# Patient Record
Sex: Male | Born: 1990 | Race: White | Hispanic: No | Marital: Single | Smoking: Never smoker
Health system: Southern US, Community
[De-identification: ages and names within clinical notes are randomized; demographics above are authoritative.]

## PROBLEM LIST (undated history)

## (undated) HISTORY — PX: HERNIA REPAIR: SHX51

---

## 2018-08-31 ENCOUNTER — Other Ambulatory Visit: Payer: Self-pay

## 2018-08-31 ENCOUNTER — Emergency Department
Admission: EM | Admit: 2018-08-31 | Discharge: 2018-08-31 | Disposition: A | Discharge status: Home / Self Care | Payer: Self-pay | Attending: Student in an Organized Health Care Education/Training Program | Admitting: Student in an Organized Health Care Education/Training Program

## 2018-08-31 ENCOUNTER — Emergency Department: Payer: Self-pay

## 2018-08-31 DIAGNOSIS — F410 Panic disorder [episodic paroxysmal anxiety] without agoraphobia: Secondary | ICD-10-CM | POA: Insufficient documentation

## 2018-08-31 DIAGNOSIS — R079 Chest pain, unspecified: Secondary | ICD-10-CM | POA: Insufficient documentation

## 2018-08-31 LAB — CBC
HCT: 49 % (ref 39.0–52.0)
Hemoglobin: 17.3 g/dL — ABNORMAL HIGH (ref 13.0–17.0)
MCH: 29.8 pg (ref 26.0–34.0)
MCHC: 35.3 g/dL (ref 30.0–36.0)
MCV: 84.5 fL (ref 80.0–100.0)
NRBC: 0 % (ref 0.0–0.2)
Platelets: 304 10*3/uL (ref 150–400)
RBC: 5.8 MIL/uL (ref 4.22–5.81)
RDW: 11.8 % (ref 11.5–15.5)
WBC: 8.8 10*3/uL (ref 4.0–10.5)

## 2018-08-31 LAB — BASIC METABOLIC PANEL
Anion gap: 10 (ref 5–15)
BUN: 7 mg/dL (ref 6–20)
CO2: 27 mmol/L (ref 22–32)
Calcium: 9.3 mg/dL (ref 8.9–10.3)
Chloride: 100 mmol/L (ref 98–111)
Creatinine, Ser: 0.85 mg/dL (ref 0.61–1.24)
GFR calc non Af Amer: >60 mL/min (ref >60)
Glucose, Bld: 104 mg/dL — ABNORMAL HIGH (ref 70–99)
Potassium: 3.3 mmol/L — ABNORMAL LOW (ref 3.5–5.1)
Sodium: 137 mmol/L (ref 135–145)

## 2018-08-31 LAB — TROPONIN I: Troponin I: <0.03 ng/mL (ref <0.03)

## 2018-08-31 MED ORDER — LORAZEPAM 1 MG PO TABS: 1.0000 mg | ORAL_TABLET | Freq: Three times a day (TID) | ORAL | 0 refills | Status: AC | PRN

## 2018-08-31 MED ORDER — LORAZEPAM 1 MG PO TABS
1.0000 mg | ORAL_TABLET | Freq: Once | ORAL | Status: AC
  Administered 2018-08-31: 1 mg via ORAL
  Filled 2018-08-31: qty 1

## 2018-08-31 NOTE — ED Notes (Addendum)
Pt reports mid chest discomfort that has been intermittent, pt states the pain is non-radiating and denies any shortness of breath. Pt states he thinks he was having a panic attack when the discomfort started. Pt took dose of metoprolol succinate 25mg  around 1400, pt states he used it to help with anxiety This medication is not currently prescribed to him.

## 2018-08-31 NOTE — ED Triage Notes (Signed)
Pt to ER via POV c/o burning like chest pain that started approx 45 minutes ago associated with SOB. Pt alert and oriented X4, active, cooperative, pt in NAD. RR even and unlabored, color WNL.

## 2018-08-31 NOTE — ED Provider Notes (Signed)
[facility] Emergency Department Provider Note    First MD Initiated Contact with Patient 08/31/18 1650     (approximate)  I have reviewed the triage vital signs and the nursing notes.   HISTORY  Chief Complaint Chest Pain    HPI Valentino SaxonKirk Vossler is a 27 y.o. male the ER for evaluation of chest discomfort with palpitations that started earlier today around 2:00.  Patient states he does have a history of anxiety.  States he took a dose of metoprolol to help with this but is not actually prescribed to him.  Denies any chest pain or pressure right now.  No pleuritic pain.  No recent fevers or shortness of breath.  States he still feels very anxious and like he is having a panic attack.  Denies any SI or HI.    History reviewed. No pertinent past medical history. No family history on file. History reviewed. No pertinent surgical history. There are no active problems to display for this patient.     Prior to Admission medications   Medication Sig Start Date End Date Taking? Authorizing Provider  LORazepam (ATIVAN) 1 MG tablet Take 1 tablet (1 mg total) by mouth every 8 (eight) hours as needed for anxiety. 08/31/18 08/31/19  Willy Eddyobinson, Aya Geisel, MD    Allergies Patient has no known allergies.    Social History Social History   Tobacco Use  . Smoking status: Never Smoker  Substance Use Topics  . Alcohol use: Not Currently  . Drug use: Not on file    Review of Systems Patient denies headaches, rhinorrhea, blurry vision, numbness, shortness of breath, chest pain, edema, cough, abdominal pain, nausea, vomiting, diarrhea, dysuria, fevers, rashes or hallucinations unless otherwise stated above in HPI. ____________________________________________   PHYSICAL EXAM:  VITAL SIGNS: Vitals:   08/31/18 1543 08/31/18 1758  BP: (!) 138/99 (!) 136/100  Pulse: (!) 108 95  Resp: 18 18  Temp: 98.4 F (36.9 C)   SpO2: 100% 99%    Constitutional: Alert and  oriented.  Eyes: Conjunctivae are normal.  Head: Atraumatic. Nose: No congestion/rhinnorhea. Mouth/Throat: Mucous membranes are moist.   Neck: No stridor. Painless ROM.  Cardiovascular: Normal rate, regular rhythm. Grossly normal heart sounds.  Good peripheral circulation. Respiratory: Normal respiratory effort.  No retractions. Lungs CTAB. Gastrointestinal: Soft and nontender. No distention. No abdominal bruits. No CVA tenderness. Genitourinary:  Musculoskeletal: No lower extremity tenderness nor edema.  No joint effusions. Neurologic:  Normal speech and language. No gross focal neurologic deficits are appreciated. No facial droop Skin:  Skin is warm, dry and intact. No rash noted. Psychiatric: Mood and affect are anxious. Speech and behavior are normal.  ____________________________________________   LABS (all labs ordered are listed, but only abnormal results are displayed)  Results for orders placed or performed during the hospital encounter of 08/31/18 (from the past 24 hour(s))  Basic metabolic panel     Status: Abnormal   Collection Time: 08/31/18  3:45 PM  Result Value Ref Range   Sodium 137 135 - 145 mmol/L   Potassium 3.3 (L) 3.5 - 5.1 mmol/L   Chloride 100 98 - 111 mmol/L   CO2 27 22 - 32 mmol/L   Glucose, Bld 104 (H) 70 - 99 mg/dL   BUN 7 6 - 20 mg/dL   Creatinine, Ser 1.610.85 0.61 - 1.24 mg/dL   Calcium 9.3 8.9 - 09.610.3 mg/dL   GFR calc non Af Amer >60 >60 mL/min   GFR calc Af Amer >60 >60  mL/min   Anion gap 10 5 - 15  CBC     Status: Abnormal   Collection Time: 08/31/18  3:45 PM  Result Value Ref Range   WBC 8.8 4.0 - 10.5 K/uL   RBC 5.80 4.22 - 5.81 MIL/uL   Hemoglobin 17.3 (H) 13.0 - 17.0 g/dL   HCT 44.0 34.7 - 42.5 %   MCV 84.5 80.0 - 100.0 fL   MCH 29.8 26.0 - 34.0 pg   MCHC 35.3 30.0 - 36.0 g/dL   RDW 95.6 38.7 - 56.4 %   Platelets 304 150 - 400 K/uL   nRBC 0.0 0.0 - 0.2 %  Troponin I - ONCE - STAT     Status: None   Collection Time: 08/31/18  3:45 PM    Result Value Ref Range   Troponin I <0.03 <0.03 ng/mL   ____________________________________________  EKG My review and personal interpretation at Time: 15:40   Indication: chest pain  Rate: 98  Rhythm: sinus Axis: normal Other: no brugada wpw or prolonged qt ____________________________________________  RADIOLOGY  I personally reviewed all radiographic images ordered to evaluate for the above acute complaints and reviewed radiology reports and findings.  These findings were personally discussed with the patient.  Please see medical record for radiology report.  ____________________________________________   PROCEDURES  Procedure(s) performed:  Procedures    Critical Care performed: no ____________________________________________   INITIAL IMPRESSION / ASSESSMENT AND PLAN / ED COURSE  Pertinent labs & imaging results that were available during my care of the patient were reviewed by me and considered in my medical decision making (see chart for details).   DDX: ACS, pericarditis, esophagitis, boerhaaves, pe, dissection, pna, bronchitis, costochondritis    Kawhi Diebold is a 27 y.o. who presents to the ED with was as described above.  EKG shows no evidence of acute ischemia.  Does not seem clinically consistent with dissection or PE.  No signs of infectious process.  Most likely panic attack.  Possible dysrhythmia but no evidence of WPW Brugada or prolonged QT.  Patient with improved symptoms after receiving Ativan.  Do believe he stable and appropriate for referral to outpatient follow-up.      As part of my medical decision making, I reviewed the following data within the electronic MEDICAL RECORD NUMBER Nursing notes reviewed and incorporated, Labs reviewed, notes from prior ED visits .  ____________________________________________   FINAL CLINICAL IMPRESSION(S) / ED DIAGNOSES  Final diagnoses:  Chest pain, unspecified type  Panic attack      NEW MEDICATIONS  STARTED DURING THIS VISIT:  New Prescriptions   LORAZEPAM (ATIVAN) 1 MG TABLET    Take 1 tablet (1 mg total) by mouth every 8 (eight) hours as needed for anxiety.     Note:  This document was prepared using Dragon voice recognition software and may include unintentional dictation errors.    Willy Eddy, MD 08/31/18 Mikle Bosworth

## 2018-09-22 ENCOUNTER — Other Ambulatory Visit: Payer: Self-pay

## 2018-09-22 ENCOUNTER — Emergency Department
Admission: EM | Admit: 2018-09-22 | Discharge: 2018-09-22 | Disposition: A | Discharge status: Home / Self Care | Payer: Self-pay | Attending: Emergency Medicine | Admitting: Emergency Medicine

## 2018-09-22 ENCOUNTER — Encounter: Payer: Self-pay | Admitting: Emergency Medicine

## 2018-09-22 DIAGNOSIS — R030 Elevated blood-pressure reading, without diagnosis of hypertension: Secondary | ICD-10-CM | POA: Insufficient documentation

## 2018-09-22 DIAGNOSIS — R000 Tachycardia, unspecified: Secondary | ICD-10-CM | POA: Insufficient documentation

## 2018-09-22 DIAGNOSIS — F419 Anxiety disorder, unspecified: Secondary | ICD-10-CM | POA: Insufficient documentation

## 2018-09-22 DIAGNOSIS — F17228 Nicotine dependence, chewing tobacco, with other nicotine-induced disorders: Secondary | ICD-10-CM | POA: Insufficient documentation

## 2018-09-22 LAB — COMPREHENSIVE METABOLIC PANEL
ALT: 43 U/L (ref 0–44)
AST: 51 U/L — AB (ref 15–41)
Albumin: 5.1 g/dL — ABNORMAL HIGH (ref 3.5–5.0)
Alkaline Phosphatase: 64 U/L (ref 38–126)
Anion gap: 11 (ref 5–15)
BUN: 7 mg/dL (ref 6–20)
CO2: 28 mmol/L (ref 22–32)
Calcium: 9.4 mg/dL (ref 8.9–10.3)
Chloride: 97 mmol/L — ABNORMAL LOW (ref 98–111)
Creatinine, Ser: 0.78 mg/dL (ref 0.61–1.24)
GFR calc Af Amer: >60 mL/min (ref >60)
GFR calc non Af Amer: >60 mL/min (ref >60)
GLUCOSE: 87 mg/dL (ref 70–99)
Potassium: 4.4 mmol/L (ref 3.5–5.1)
Sodium: 136 mmol/L (ref 135–145)
Total Bilirubin: 1 mg/dL (ref 0.3–1.2)
Total Protein: 8.2 g/dL — ABNORMAL HIGH (ref 6.5–8.1)

## 2018-09-22 LAB — URINE DRUG SCREEN, QUALITATIVE (ARMC ONLY)
Amphetamines, Ur Screen: NOT DETECTED
Barbiturates, Ur Screen: NOT DETECTED
Benzodiazepine, Ur Scrn: NOT DETECTED
Cannabinoid 50 Ng, Ur ~~LOC~~: NOT DETECTED
Cocaine Metabolite,Ur ~~LOC~~: NOT DETECTED
MDMA (Ecstasy)Ur Screen: NOT DETECTED
Methadone Scn, Ur: NOT DETECTED
Opiate, Ur Screen: NOT DETECTED
PHENCYCLIDINE (PCP) UR S: NOT DETECTED
Tricyclic, Ur Screen: NOT DETECTED

## 2018-09-22 LAB — CBC
HEMATOCRIT: 51.8 % (ref 39.0–52.0)
Hemoglobin: 18 g/dL — ABNORMAL HIGH (ref 13.0–17.0)
MCH: 29.7 pg (ref 26.0–34.0)
MCHC: 34.7 g/dL (ref 30.0–36.0)
MCV: 85.5 fL (ref 80.0–100.0)
Platelets: 290 10*3/uL (ref 150–400)
RBC: 6.06 MIL/uL — ABNORMAL HIGH (ref 4.22–5.81)
RDW: 11.8 % (ref 11.5–15.5)
WBC: 6.8 10*3/uL (ref 4.0–10.5)
nRBC: 0 % (ref 0.0–0.2)

## 2018-09-22 LAB — ETHANOL: Alcohol, Ethyl (B): <10 mg/dL (ref <10)

## 2018-09-22 LAB — TSH: TSH: 2.17 u[IU]/mL (ref 0.350–4.500)

## 2018-09-22 MED ORDER — METOPROLOL TARTRATE 25 MG PO TABS: 25.0000 mg | ORAL_TABLET | Freq: Two times a day (BID) | ORAL | 0 refills | Status: DC

## 2018-09-22 MED ORDER — METOPROLOL TARTRATE 25 MG PO TABS: 25.0000 mg | ORAL_TABLET | Freq: Two times a day (BID) | ORAL | 11 refills | Status: DC

## 2018-09-22 NOTE — Discharge Instructions (Addendum)
Please try the metoprolol 1 pill twice a day.  This may help your anxiety and should keep your heart rate down.  Please follow-up with primary care.  You can try [facility] care or the [facility] or the [facility] or the [facility] or [facility].  [facility] also has a residents clinic where the doctors in training see the patient's and or supervised by the fully trained doctors while they do so.  These return for any further problems or if you get worse at all.  Return also for more rapid heart rate shortness of breath nausea vomiting fever etc.

## 2018-09-22 NOTE — BH Assessment (Signed)
Per request of ER MD Darnelle Catalan), writer provided the pt. with information and instructions on how to access [facility] ([facility] [employer]).   Patient denies SI/HI and AV/H.  __________________ [facility] [address]  [place], [place] [address] [phone]  [facility] [address]  [place], [place] [address] [phone]

## 2018-09-22 NOTE — ED Triage Notes (Signed)
Pt in via POV, reports being seen here 2 weeks ago, dx w/ anxiety and given prescription for Ativan.  Pt states medication is not working, denies any hx of same.  Pt tachycardic and hypertensive upon arrival; reports being advised to follow up about blood pressure but states he does not have a PCP.  Ambulatory to triage.  NAD noted at this time.

## 2018-09-22 NOTE — ED Provider Notes (Signed)
[facility] Emergency Department Provider Note   ____________________________________________   First MD Initiated Contact with Patient 09/22/18 1507     (approximate)  I have reviewed the triage vital signs and the nursing notes.   HISTORY  Chief Complaint Anxiety   HPI Harold Kelley is a 28 y.o. male patient reports he was seen here earlier given Ativan for anxiety.  He reports is not helping.  Reports he when he takes it he only gets sleepy.  He took 1 this morning and is still very anxious but sleepy.  He says his heart is racing.  He has in the past taken metoprolol this seemed to help him.  He took 1 of someone else's.  He also says his blood pressure was high at home 140 systolic.  He denies any other medical problems.  He says he has a lot of problems going on at home that he is working on and this is with making him anxious.   History reviewed. No pertinent past medical history.  There are no active problems to display for this patient.   Past Surgical History:  Procedure Laterality Date  . HERNIA REPAIR      Prior to Admission medications   Medication Sig Start Date End Date Taking? Authorizing Provider  LORazepam (ATIVAN) 1 MG tablet Take 1 tablet (1 mg total) by mouth every 8 (eight) hours as needed for anxiety. 08/31/18 08/31/19  Willy Eddy, MD  metoprolol tartrate (LOPRESSOR) 25 MG tablet Take 1 tablet (25 mg total) by mouth 2 (two) times daily. 09/22/18 09/22/19  Arnaldo Natal, MD    Allergies Patient has no known allergies.  History reviewed. No pertinent family history.  Social History Social History   Tobacco Use  . Smoking status: Never Smoker  . Smokeless tobacco: Current User    Types: Snuff  Substance Use Topics  . Alcohol use: Not Currently  . Drug use: Never    Review of Systems  Constitutional: No fever/chills Eyes: No visual changes. ENT: No sore throat. Cardiovascular: Denies chest pain. Respiratory:  Denies shortness of breath. Gastrointestinal: No abdominal pain.  No nausea, no vomiting.  No diarrhea.  No constipation. Genitourinary: Negative for dysuria. Musculoskeletal: Negative for back pain. Skin: Negative for rash. Neurological: Negative for headaches, focal weakness   ____________________________________________   PHYSICAL EXAM:  VITAL SIGNS: ED Triage Vitals  Enc Vitals Group     BP --      Pulse Rate 09/22/18 1404 (!) 123     Resp 09/22/18 1404 20     Temp 09/22/18 1404 98.3 F (36.8 C)     Temp Source 09/22/18 1404 Oral     SpO2 09/22/18 1404 100 %     Weight 09/22/18 1405 190 lb (86.2 kg)     Height 09/22/18 1405 5\' 9"  (1.753 m)     Head Circumference --      Peak Flow --      Pain Score 09/22/18 1405 0     Pain Loc --      Pain Edu? --      Excl. in GC? --     Constitutional: Alert and oriented. Well appearing and in no acute distress. Eyes: Conjunctivae are normal. PER. EOMI. Head: Atraumatic. Nose: No congestion/rhinnorhea. Mouth/Throat: Mucous membranes are moist.  Oropharynx non-erythematous. Neck: No stridor.   Cardiovascular: Normal rate, regular rhythm. Grossly normal heart sounds.  Good peripheral circulation. Respiratory: Normal respiratory effort.  No retractions. Lungs CTAB. Gastrointestinal: Soft  and nontender. No distention. No abdominal bruits. No CVA tenderness. Musculoskeletal: No lower extremity tenderness nor edema.  Neurologic:  Normal speech and language. No gross focal neurologic deficits are appreciated. No gait instability. Skin:  Skin is warm, dry and intact. No rash noted. Psychiatric: Mood and affect are normal. Speech and behavior are normal.  ____________________________________________   LABS (all labs ordered are listed, but only abnormal results are displayed)  Labs Reviewed  COMPREHENSIVE METABOLIC PANEL - Abnormal; Notable for the following components:      Result Value   Chloride 97 (*)    Total Protein 8.2 (*)      Albumin 5.1 (*)    AST 51 (*)    All other components within normal limits  CBC - Abnormal; Notable for the following components:   RBC 6.06 (*)    Hemoglobin 18.0 (*)    All other components within normal limits  ETHANOL  URINE DRUG SCREEN, QUALITATIVE ([facility] ONLY)  TSH   ____________________________________________  EKG  EKG read interpreted by me shows sinus tachycardia rate of 112 normal axis essentially normal EKG.  When I palpated the patient's pulse there did seem to be some extra beats but not on the EKG ____________________________________________  RADIOLOGY  ED MD interpretation:   Official radiology report(s): No results found.  ____________________________________________   PROCEDURES  Procedure(s) performed:   Procedures  Critical Care performed:   ____________________________________________   INITIAL IMPRESSION / ASSESSMENT AND PLAN / ED COURSE  Patient's lab work is normal EKG looks okay except for being a little fast.  TSH is normal as well.  I will have the patient follow-up with primary care give him some options.  [name] I did talk to him about anxiety and gave him follow-up with [facility] if he needs it.  I will let him go at this point.  He can return for any further problems.  Since the metoprolol seem to work I will let him try that.  Can also continue the Ativan as he has at least 8 pills left.         ____________________________________________   FINAL CLINICAL IMPRESSION(S) / ED DIAGNOSES  Final diagnoses:  Anxiety     ED Discharge Orders         Ordered    metoprolol tartrate (LOPRESSOR) 25 MG tablet  2 times daily     09/22/18 1622           Note:  This document was prepared using Dragon voice recognition software and may include unintentional dictation errors.    Arnaldo Natal, MD 09/22/18 1623

## 2019-08-20 IMAGING — CR DG CHEST 2V
1 series · 2 of 2 positions shown · non-contrast
Comparison: None.

CLINICAL DATA: Chest pain and shortness of breath

EXAM:
CHEST - 2 VIEW

[Series 1: dg chest 2 view · 0.14mm/px · 2 of 2 slices shown]
[im 1/2]
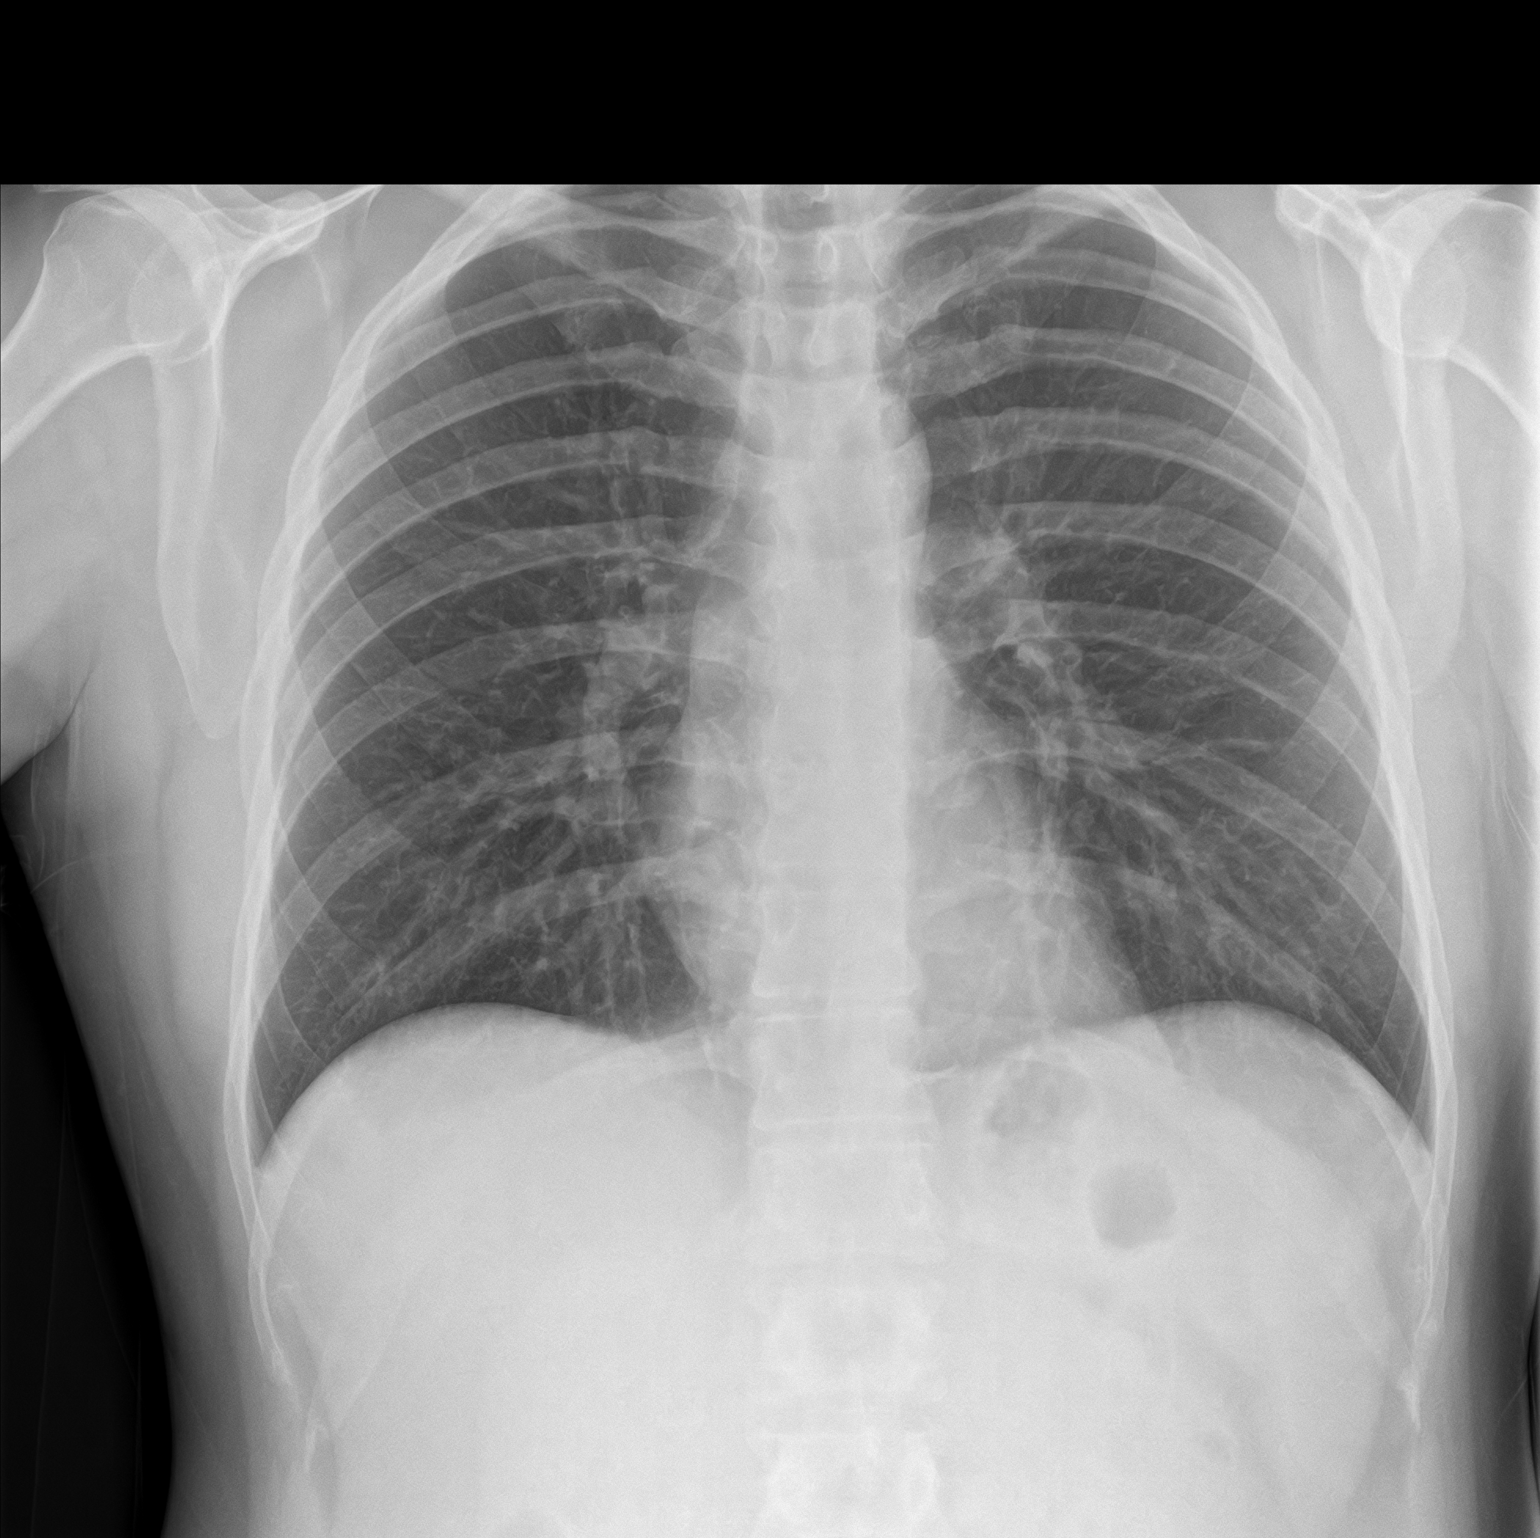
[im 2/2]
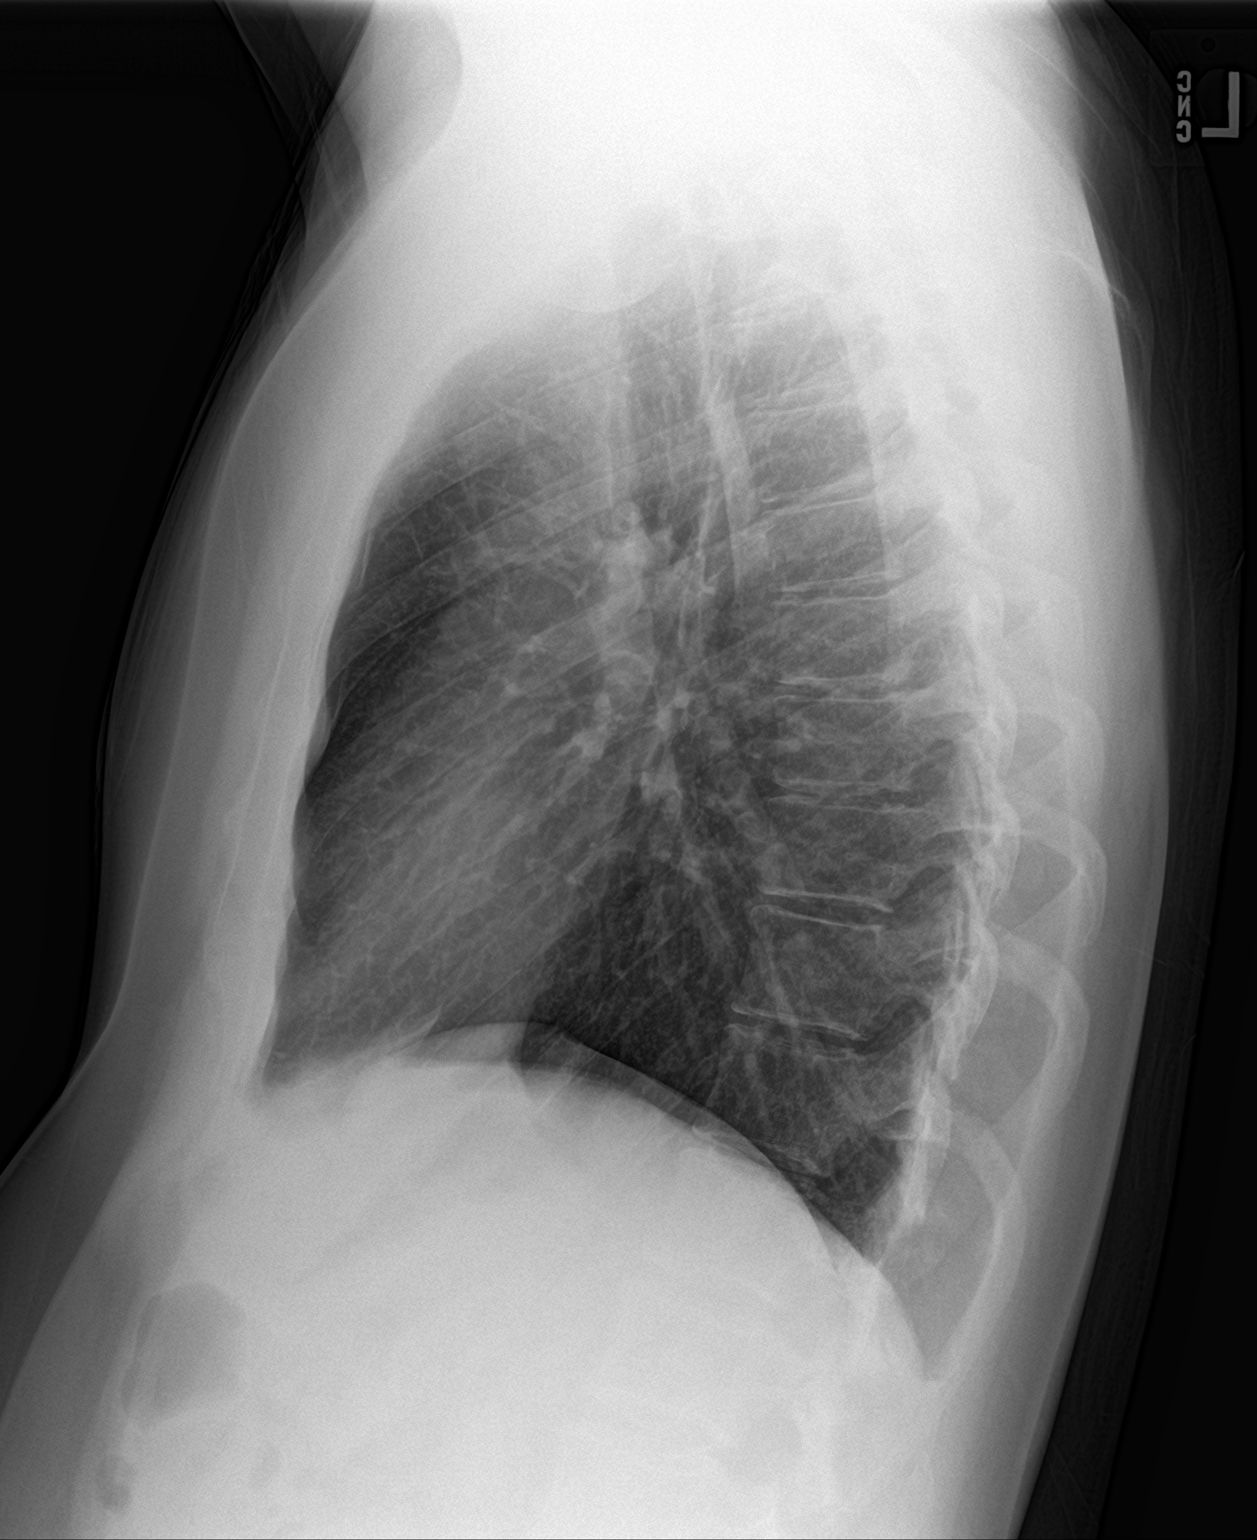

[2 of 2 positions shown; findings below may reference images not displayed]

FINDINGS: Lungs are clear. The heart size and pulmonary vascularity are
normal. No adenopathy. No pneumothorax. No bone lesions.
IMPRESSION: No edema or consolidation.

## 2022-05-13 ENCOUNTER — Ambulatory Visit: Payer: Self-pay

## 2022-05-13 NOTE — Telephone Encounter (Signed)
    Chief Complaint: Feeling anxious Symptoms: Comes and goes, affects sleep. Frequency: 3 years Pertinent Negatives: Patient denies thoughts of self-harm Disposition: [] ED /[x] Urgent Care (no appt availability in office) / [] Appointment(In office/virtual)/ []  [facility]/ [] Home Care/ [] Refused Recommended Disposition /[] [facility]/ []  Follow-up with PCP Additional Notes: New pt. Appointment made.  Reason for Disposition  MODERATE anxiety (e.g., persistent or frequent anxiety symptoms; interferes with sleep, school, or work)  Answer Assessment - Initial Assessment Questions 1. CONCERN: "Did anything happen that prompted you to call today?"      Anxiety 2. ANXIETY SYMPTOMS: "Can you describe how you (your loved one; patient) have been feeling?" (e.g., tense, restless, panicky, anxious, keyed up, overwhelmed, sense of impending doom).      Anxiety 3. ONSET: "How long have you been feeling this way?" (e.g., hours, days, weeks)     3 years ago 4. SEVERITY: "How would you rate the level of anxiety?" (e.g., 0 - 10; or mild, moderate, severe).     At times - Moderate 5. FUNCTIONAL IMPAIRMENT: "How have these feelings affected your ability to do daily activities?" "Have you had more difficulty than usual doing your normal daily activities?" (e.g., getting better, same, worse; self-care, school, work, interactions)     Affects sleep 6. HISTORY: "Have you felt this way before?" "Have you ever been diagnosed with an anxiety problem in the past?" (e.g., generalized anxiety disorder, panic attacks, PTSD). If Yes, ask: "How was this problem treated?" (e.g., medicines, counseling, etc.)     Yes 7. RISK OF HARM - SUICIDAL IDEATION: "Do you ever have thoughts of hurting or killing yourself?" If Yes, ask:  "Do you have these feelings now?" "Do you have a plan on how you would do this?"     No 8. TREATMENT:  "What has been done so far to treat this anxiety?" (e.g., medicines,  relaxation strategies). "What has helped?"     Has been on medications 9. TREATMENT - THERAPIST: "Do you have a counselor or therapist? Name?"     No 10. POTENTIAL TRIGGERS: "Do you drink caffeinated beverages (e.g., coffee, colas, teas), and how much daily?" "Do you drink alcohol or use any drugs?" "Have you started any new medicines recently?"       No 11. PATIENT SUPPORT: "Who is with you now?" "Who do you live with?" "Do you have family or friends who you can talk to?"        Yes 12. OTHER SYMPTOMS: "Do you have any other symptoms?" (e.g., feeling depressed, trouble concentrating, trouble sleeping, trouble breathing, palpitations or fast heartbeat, chest pain, sweating, nausea, or diarrhea)       Sleeping 13. PREGNANCY: "Is there any chance you are pregnant?" "When was your last menstrual period?"       N/a  Protocols used: Anxiety and Panic Attack-A-AH

## 2022-08-04 ENCOUNTER — Encounter: Payer: Self-pay | Admitting: General Practice

## 2022-08-19 ENCOUNTER — Ambulatory Visit: Payer: Self-pay | Admitting: Family Medicine

## 2024-05-04 ENCOUNTER — Ambulatory Visit (INDEPENDENT_AMBULATORY_CARE_PROVIDER_SITE_OTHER): Payer: Self-pay | Admitting: Family Medicine

## 2024-05-04 ENCOUNTER — Encounter: Payer: Self-pay | Admitting: Family Medicine

## 2024-05-04 VITALS — BP 118/64 | HR 103 | Temp 97.6°F | Ht 70.0 in | Wt 162.6 lb

## 2024-05-04 DIAGNOSIS — I10 Essential (primary) hypertension: Secondary | ICD-10-CM

## 2024-05-04 DIAGNOSIS — F419 Anxiety disorder, unspecified: Secondary | ICD-10-CM

## 2024-05-04 DIAGNOSIS — F1011 Alcohol abuse, in remission: Secondary | ICD-10-CM

## 2024-05-04 DIAGNOSIS — Z136 Encounter for screening for cardiovascular disorders: Secondary | ICD-10-CM

## 2024-05-04 LAB — MICROALBUMIN, URINE WAIVED
Creatinine, Urine Waived: 50 mg/dL (ref 10–300)
Microalb, Ur Waived: 10 mg/L (ref 0–19)

## 2024-05-04 MED ORDER — BUSPIRONE HCL 5 MG PO TABS: 5.0000 mg | ORAL_TABLET | Freq: Two times a day (BID) | ORAL | 1 refills | Status: AC | PRN

## 2024-05-04 MED ORDER — LISINOPRIL 5 MG PO TABS: 5.0000 mg | ORAL_TABLET | Freq: Every day | ORAL | 1 refills | Status: AC

## 2024-05-04 MED ORDER — CITALOPRAM HYDROBROMIDE 10 MG PO TABS: 10.0000 mg | ORAL_TABLET | Freq: Every day | ORAL | 1 refills | Status: AC

## 2024-05-04 NOTE — Progress Notes (Signed)
 BP 118/64   Pulse (!) 103   Temp 97.6 F (36.4 C) (Oral)   Ht 5' 10 (1.778 m)   Wt 162 lb 9.6 oz (73.8 kg)   SpO2 97%   BMI 23.33 kg/m    Subjective:    Patient ID: Harold Kelley, male    DOB: 1991-08-14, 33 y.o.   MRN: [id number]  HPI: Harold Kelley is a 33 y.o. male  Chief Complaint  Patient presents with   Establish Care   Anxiety    Would like to discuss going on an anxiety medication    Depression   Hypertension   HYPERTENSION  Hypertension status: fluctuating  Satisfied with current treatment? no Duration of hypertension: chronic BP monitoring frequency:  not checking BP medication side effects:  yes- insomnia on metoprolol  Medication compliance: has been off of everything for unknown amount of time Previous BP meds:metoprolol  Aspirin: no Recurrent headaches: yes Visual changes: no Palpitations: no Dyspnea: no Chest pain: no Lower extremity edema: no Dizzy/lightheaded: no  ANXIETY/STRESS Duration: 5+ years, got bad during covid, but has been worse recently Status:uncontrolled Anxious mood: yes  Excessive worrying: yes Irritability: yes  Sweating: no Nausea: no Palpitations:no Hyperventilation: no Panic attacks: no Agoraphobia: no  Obscessions/compulsions: no Depressed mood: yes    05/04/2024   12:06 PM  Depression screen PHQ 2/9  Decreased Interest 1  Down, Depressed, Hopeless 0  PHQ - 2 Score 1  Altered sleeping 1  Tired, decreased energy 2  Change in appetite 2  Feeling bad or failure about yourself  0  Trouble concentrating 0  Moving slowly or fidgety/restless 0  Suicidal thoughts 0  PHQ-9 Score 6  Difficult doing work/chores Not difficult at all   Anhedonia: no Weight changes: no Insomnia: no   Hypersomnia: no Fatigue/loss of energy: yes Feelings of worthlessness: no Feelings of guilt: yes Impaired concentration/indecisiveness: no Suicidal ideations: no  Crying spells: no Recent Stressors/Life Changes: no   Relationship  problems: no   Family stress: no     Financial stress: no    Job stress: no    Recent death/loss: no   Active Ambulatory Problems    Diagnosis Date Noted   Anxiety 05/04/2024   Primary hypertension 05/04/2024   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   citalopram  (CELEXA ) 10 MG tablet Take 1 tablet (10 mg total) by mouth daily.   lisinopril  (ZESTRIL ) 5 MG tablet Take 1 tablet (5 mg total) by mouth daily.   [DISCONTINUED] metoprolol  tartrate (LOPRESSOR ) 25 MG tablet Take 1 tablet (25 mg total) by mouth 2 (two) times daily.   No facility-administered encounter medications on file as of 05/04/2024.   Allergies  Allergen Reactions   Metoprolol  Other (See Comments)    insomnia    Social History   Socioeconomic History   Marital status: Single    Spouse name: Not on file   Number of children: Not on file   Years of education: Not on file   Highest education level: Not on file  Occupational History   Not on file  Tobacco Use  Smoking status: Never   Smokeless tobacco: Current    Types: Snuff  Vaping Use   Vaping status: Never Used  Substance and Sexual Activity   Alcohol use: Not Currently    Comment: has quit. July 2025   Drug use: Never   Sexual activity: Not Currently  Other Topics Concern   Not on file  Social History Narrative   Not on file   Social Drivers of Health   Financial Resource Strain: Low Risk  (05/04/2024)   Overall Financial Resource Strain (CARDIA)    Difficulty of Paying Living Expenses: Not very hard  Food Insecurity: No Food Insecurity (05/04/2024)   Hunger Vital Sign     Worried About Running Out of Food in the Last Year: Never true    Ran Out of Food in the Last Year: Never true  Transportation Needs: No Transportation Needs (05/04/2024)   PRAPARE - Administrator, Civil Service (Medical): No    Lack of Transportation (Non-Medical): No  Physical Activity: Insufficiently Active (05/04/2024)   Exercise Vital Sign    Days of Exercise per Week: 3 days    Minutes of Exercise per Session: 30 min  Stress: Stress Concern Present (05/04/2024)   [employer] of Occupational Health - Occupational Stress Questionnaire    Feeling of Stress: Rather much  Social Connections: Socially Isolated (05/04/2024)   Social Connection and Isolation Panel    Frequency of Communication with Friends and Family: Three times a week    Frequency of Social Gatherings with Friends and Family: Once a week    Attends Religious Services: Never    Database administrator or Organizations: No    Attends Engineer, structural: Never    Marital Status: Separated   Family History  Problem Relation Age of Onset   Healthy Mother    Healthy Father    Healthy Maternal Grandmother    Healthy Maternal Grandfather    Healthy Paternal Grandmother    Healthy Paternal Grandfather      Review of Systems  Constitutional: Negative.   HENT: Negative.    Respiratory: Negative.    Cardiovascular:  Positive for palpitations. Negative for chest pain and leg swelling.  Musculoskeletal: Negative.   Skin: Negative.   Neurological:  Positive for headaches. Negative for dizziness, tremors, seizures, syncope, facial asymmetry, speech difficulty, weakness, light-headedness and numbness.  Psychiatric/Behavioral:  Positive for dysphoric mood and sleep disturbance. Negative for agitation, behavioral problems, confusion, decreased concentration, hallucinations, self-injury and suicidal ideas. The patient is nervous/anxious. The patient is not hyperactive.     Per HPI unless  specifically indicated above     Objective:    BP 118/64   Pulse (!) 103   Temp 97.6 F (36.4 C) (Oral)   Ht 5' 10 (1.778 m)   Wt 162 lb 9.6 oz (73.8 kg)   SpO2 97%   BMI 23.33 kg/m   Wt Readings from Last 3 Encounters:  05/04/24 162 lb 9.6 oz (73.8 kg)  09/22/18 190 lb (86.2 kg)  08/31/18 200 lb (90.7 kg)    Physical Exam Vitals and nursing note reviewed.  Constitutional:      General: He is not in acute distress.    Appearance: Normal appearance. He is not ill-appearing, toxic-appearing or diaphoretic.  HENT:     Head: Normocephalic and atraumatic.     Right Ear: External ear normal.     Left Ear: External ear normal.     Nose: Nose normal.  Mouth/Throat:     Mouth: Mucous membranes are moist.     Pharynx: Oropharynx is clear.  Eyes:     General: No scleral icterus.       Right eye: No discharge.        Left eye: No discharge.     Extraocular Movements: Extraocular movements intact.     Conjunctiva/sclera: Conjunctivae normal.     Pupils: Pupils are equal, round, and reactive to light.  Cardiovascular:     Rate and Rhythm: Normal rate and regular rhythm.     Pulses: Normal pulses.     Heart sounds: Normal heart sounds. No murmur heard.    No friction rub. No gallop.  Pulmonary:     Effort: Pulmonary effort is normal. No respiratory distress.     Breath sounds: Normal breath sounds. No stridor. No wheezing, rhonchi or rales.  Chest:     Chest wall: No tenderness.  Musculoskeletal:        General: Normal range of motion.     Cervical back: Normal range of motion and neck supple.  Skin:    General: Skin is warm and dry.     Capillary Refill: Capillary refill takes less than 2 seconds.     Coloration: Skin is not jaundiced or pale.     Findings: No bruising, erythema, lesion or rash.  Neurological:     General: No focal deficit present.     Mental Status: He is alert and oriented to person, place, and time. Mental status is at baseline.  Psychiatric:         Mood and Affect: Mood normal.        Behavior: Behavior normal.        Thought Content: Thought content normal.        Judgment: Judgment normal.     Results for orders placed or performed in visit on 05/04/24  Microalbumin, Urine Waived   Collection Time: 05/04/24  8:48 AM  Result Value Ref Range   Microalb, Ur Waived 10 0 - 19 mg/L   Creatinine, Urine Waived 50 10 - 300 mg/dL   Microalb/Creat Ratio 30-300 (H) <30 mg/g      Assessment & Plan:   Problem List Items Addressed This Visit       Cardiovascular and Mediastinum   Primary hypertension   Better on recheck, but with +microalbumin. Will start 5mg  lisinopril . Recheck 1 month. Call with any concerns.       Relevant Medications   lisinopril  (ZESTRIL ) 5 MG tablet   Other Relevant Orders   Comprehensive metabolic panel with GFR   CBC with Differential/Platelet   TSH   Microalbumin, Urine Waived (Completed)     Other   Anxiety - Primary   Will start him on celexa  and buspar . Recheck 1 month. Call with any concerns.       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 Call with any concerns. Continue to monitor.       Other Visit Diagnoses       Screening for cardiovascular condition       Labs drawn today. Await results.   Relevant Orders   Lipid Panel w/o Chol/HDL Ratio        Follow up plan: Return in  about 4 weeks (around 06/01/2024) for follow up mood.

## 2024-05-04 NOTE — Progress Notes (Signed)
 BP 118/64   Pulse (!) 103   Temp 97.6 F (36.4 C) (Oral)   Ht 5' 10 (1.778 m)   Wt 162 lb 9.6 oz (73.8 kg)   SpO2 97%   BMI 23.33 kg/m    Subjective:    Patient ID: Harold Kelley, male    DOB: 1991-08-14, 32 y.o.   MRN: [id number]  HPI: Harold Kelley is a 33 y.o. male  Chief Complaint  Patient presents with   Establish Care   Anxiety    Would like to discuss going on an anxiety medication    Depression   Hypertension   HYPERTENSION  Hypertension status: fluctuating  Satisfied with current treatment? no Duration of hypertension: chronic BP monitoring frequency:  not checking BP medication side effects:  yes- insomnia on metoprolol  Medication compliance: has been off of everything for unknown amount of time Previous BP meds:metoprolol  Aspirin: no Recurrent headaches: yes Visual changes: no Palpitations: no Dyspnea: no Chest pain: no Lower extremity edema: no Dizzy/lightheaded: no  ANXIETY/STRESS Duration: 5+ years, got bad during covid, but has been worse recently Status:uncontrolled Anxious mood: yes  Excessive worrying: yes Irritability: yes  Sweating: no Nausea: no Palpitations:no Hyperventilation: no Panic attacks: no Agoraphobia: no  Obscessions/compulsions: no Depressed mood: yes    05/04/2024   12:06 PM  Depression screen PHQ 2/9  Decreased Interest 1  Down, Depressed, Hopeless 0  PHQ - 2 Score 1  Altered sleeping 1  Tired, decreased energy 2  Change in appetite 2  Feeling bad or failure about yourself  0  Trouble concentrating 0  Moving slowly or fidgety/restless 0  Suicidal thoughts 0  PHQ-9 Score 6  Difficult doing work/chores Not difficult at all   Anhedonia: no Weight changes: no Insomnia: no   Hypersomnia: no Fatigue/loss of energy: yes Feelings of worthlessness: no Feelings of guilt: yes Impaired concentration/indecisiveness: no Suicidal ideations: no  Crying spells: no Recent Stressors/Life Changes: no   Relationship  problems: no   Family stress: no     Financial stress: no    Job stress: no    Recent death/loss: no   Active Ambulatory Problems    Diagnosis Date Noted   Anxiety 05/04/2024   Primary hypertension 05/04/2024   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   citalopram  (CELEXA ) 10 MG tablet Take 1 tablet (10 mg total) by mouth daily.   lisinopril  (ZESTRIL ) 5 MG tablet Take 1 tablet (5 mg total) by mouth daily.   [DISCONTINUED] metoprolol  tartrate (LOPRESSOR ) 25 MG tablet Take 1 tablet (25 mg total) by mouth 2 (two) times daily.   No facility-administered encounter medications on file as of 05/04/2024.   Allergies  Allergen Reactions   Metoprolol  Other (See Comments)    insomnia    Social History   Socioeconomic History   Marital status: Single    Spouse name: Not on file   Number of children: Not on file   Years of education: Not on file   Highest education level: Not on file  Occupational History   Not on file  Tobacco Use  Smoking status: Never   Smokeless tobacco: Current    Types: Snuff  Vaping Use   Vaping status: Never Used  Substance and Sexual Activity   Alcohol use: Not Currently    Comment: has quit. July 2025   Drug use: Never   Sexual activity: Not Currently  Other Topics Concern   Not on file  Social History Narrative   Not on file   Social Drivers of Health   Financial Resource Strain: Low Risk  (05/04/2024)   Overall Financial Resource Strain (CARDIA)    Difficulty of Paying Living Expenses: Not very hard  Food Insecurity: No Food Insecurity (05/04/2024)   Hunger Vital Sign     Worried About Running Out of Food in the Last Year: Never true    Ran Out of Food in the Last Year: Never true  Transportation Needs: No Transportation Needs (05/04/2024)   PRAPARE - Administrator, Civil Service (Medical): No    Lack of Transportation (Non-Medical): No  Physical Activity: Insufficiently Active (05/04/2024)   Exercise Vital Sign    Days of Exercise per Week: 3 days    Minutes of Exercise per Session: 30 min  Stress: Stress Concern Present (05/04/2024)   [employer] of Occupational Health - Occupational Stress Questionnaire    Feeling of Stress: Rather much  Social Connections: Socially Isolated (05/04/2024)   Social Connection and Isolation Panel    Frequency of Communication with Friends and Family: Three times a week    Frequency of Social Gatherings with Friends and Family: Once a week    Attends Religious Services: Never    Database administrator or Organizations: No    Attends Engineer, structural: Never    Marital Status: Separated   Family History  Problem Relation Age of Onset   Healthy Mother    Healthy Father    Healthy Maternal Grandmother    Healthy Maternal Grandfather    Healthy Paternal Grandmother    Healthy Paternal Grandfather      Review of Systems  Constitutional: Negative.   HENT: Negative.    Respiratory: Negative.    Cardiovascular:  Positive for palpitations. Negative for chest pain and leg swelling.  Musculoskeletal: Negative.   Skin: Negative.   Neurological:  Positive for headaches. Negative for dizziness, tremors, seizures, syncope, facial asymmetry, speech difficulty, weakness, light-headedness and numbness.  Psychiatric/Behavioral:  Positive for dysphoric mood and sleep disturbance. Negative for agitation, behavioral problems, confusion, decreased concentration, hallucinations, self-injury and suicidal ideas. The patient is nervous/anxious. The patient is not hyperactive.     Per HPI unless  specifically indicated above     Objective:    BP 118/64   Pulse (!) 103   Temp 97.6 F (36.4 C) (Oral)   Ht 5' 10 (1.778 m)   Wt 162 lb 9.6 oz (73.8 kg)   SpO2 97%   BMI 23.33 kg/m   Wt Readings from Last 3 Encounters:  05/04/24 162 lb 9.6 oz (73.8 kg)  09/22/18 190 lb (86.2 kg)  08/31/18 200 lb (90.7 kg)    Physical Exam Vitals and nursing note reviewed.  Constitutional:      General: He is not in acute distress.    Appearance: Normal appearance. He is not ill-appearing, toxic-appearing or diaphoretic.  HENT:     Head: Normocephalic and atraumatic.     Right Ear: External ear normal.     Left Ear: External ear normal.     Nose: Nose normal.  Mouth/Throat:     Mouth: Mucous membranes are moist.     Pharynx: Oropharynx is clear.  Eyes:     General: No scleral icterus.       Right eye: No discharge.        Left eye: No discharge.     Extraocular Movements: Extraocular movements intact.     Conjunctiva/sclera: Conjunctivae normal.     Pupils: Pupils are equal, round, and reactive to light.  Cardiovascular:     Rate and Rhythm: Normal rate and regular rhythm.     Pulses: Normal pulses.     Heart sounds: Normal heart sounds. No murmur heard.    No friction rub. No gallop.  Pulmonary:     Effort: Pulmonary effort is normal. No respiratory distress.     Breath sounds: Normal breath sounds. No stridor. No wheezing, rhonchi or rales.  Chest:     Chest wall: No tenderness.  Musculoskeletal:        General: Normal range of motion.     Cervical back: Normal range of motion and neck supple.  Skin:    General: Skin is warm and dry.     Capillary Refill: Capillary refill takes less than 2 seconds.     Coloration: Skin is not jaundiced or pale.     Findings: No bruising, erythema, lesion or rash.  Neurological:     General: No focal deficit present.     Mental Status: He is alert and oriented to person, place, and time. Mental status is at baseline.  Psychiatric:         Mood and Affect: Mood normal.        Behavior: Behavior normal.        Thought Content: Thought content normal.        Judgment: Judgment normal.     Results for orders placed or performed in visit on 05/04/24  Microalbumin, Urine Waived   Collection Time: 05/04/24  8:48 AM  Result Value Ref Range   Microalb, Ur Waived 10 0 - 19 mg/L   Creatinine, Urine Waived 50 10 - 300 mg/dL   Microalb/Creat Ratio 30-300 (H) <30 mg/g      Assessment & Plan:   Problem List Items Addressed This Visit       Cardiovascular and Mediastinum   Primary hypertension   Better on recheck, but with +microalbumin. Will start 5mg  lisinopril . Recheck 1 month. Call with any concerns.       Relevant Medications   lisinopril  (ZESTRIL ) 5 MG tablet   Other Relevant Orders   Comprehensive metabolic panel with GFR   CBC with Differential/Platelet   TSH   Microalbumin, Urine Waived (Completed)     Other   Anxiety - Primary   Will start him on celexa  and buspar . Recheck 1 month. Call with any concerns.       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 Call with any concerns. Continue to monitor.       Other Visit Diagnoses       Screening for cardiovascular condition       Labs drawn today. Await results.   Relevant Orders   Lipid Panel w/o Chol/HDL Ratio        Follow up plan: Return in  about 4 weeks (around 06/01/2024) for follow up mood.

## 2024-05-04 NOTE — Assessment & Plan Note (Signed)
 Encouraged continued abstinence. Call with any concerns. Continue to monitor.

## 2024-05-04 NOTE — Assessment & Plan Note (Signed)
 Better on recheck, but with +microalbumin. Will start 5mg  lisinopril . Recheck 1 month. Call with any concerns.

## 2024-05-04 NOTE — Assessment & Plan Note (Signed)
 Will start him on celexa  and buspar . Recheck 1 month. Call with any concerns.

## 2024-05-05 LAB — TSH: TSH: 2.82 u[IU]/mL (ref 0.450–4.500)

## 2024-05-05 LAB — COMPREHENSIVE METABOLIC PANEL WITH GFR
ALT: 14 IU/L (ref 0–44)
AST: 18 IU/L (ref 0–40)
Albumin: 4.5 g/dL (ref 4.1–5.1)
Alkaline Phosphatase: 88 IU/L (ref 44–121)
BUN/Creatinine Ratio: 1 — ABNORMAL LOW (ref 9–20)
BUN: 1 mg/dL — CL (ref 6–20)
Bilirubin Total: 0.4 mg/dL (ref 0.0–1.2)
CO2: 24 mmol/L (ref 20–29)
Calcium: 9.6 mg/dL (ref 8.7–10.2)
Chloride: 98 mmol/L (ref 96–106)
Creatinine, Ser: 0.82 mg/dL (ref 0.76–1.27)
Globulin, Total: 2.7 g/dL (ref 1.5–4.5)
Glucose: 61 mg/dL — ABNORMAL LOW (ref 70–99)
Potassium: 3.5 mmol/L (ref 3.5–5.2)
Sodium: 139 mmol/L (ref 134–144)
Total Protein: 7.2 g/dL (ref 6.0–8.5)
eGFR: 118 mL/min/1.73 (ref >59)

## 2024-05-05 LAB — CBC WITH DIFFERENTIAL/PLATELET
Basophils Absolute: 0.1 x10E3/uL (ref 0.0–0.2)
Basos: 1 %
EOS (ABSOLUTE): 0.1 x10E3/uL (ref 0.0–0.4)
Eos: 2 %
Hematocrit: 48.4 % (ref 37.5–51.0)
Hemoglobin: 16.3 g/dL (ref 13.0–17.7)
Immature Grans (Abs): 0 x10E3/uL (ref 0.0–0.1)
Immature Granulocytes: 0 %
Lymphocytes Absolute: 2.1 x10E3/uL (ref 0.7–3.1)
Lymphs: 36 %
MCH: 30.4 pg (ref 26.6–33.0)
MCHC: 33.7 g/dL (ref 31.5–35.7)
MCV: 90 fL (ref 79–97)
Monocytes Absolute: 0.5 x10E3/uL (ref 0.1–0.9)
Monocytes: 9 %
Neutrophils Absolute: 3 x10E3/uL (ref 1.4–7.0)
Neutrophils: 52 %
Platelets: 385 x10E3/uL (ref 150–450)
RBC: 5.37 x10E6/uL (ref 4.14–5.80)
RDW: 11.8 % (ref 11.6–15.4)
WBC: 5.9 x10E3/uL (ref 3.4–10.8)

## 2024-05-05 LAB — LIPID PANEL W/O CHOL/HDL RATIO
Cholesterol, Total: 171 mg/dL (ref 100–199)
HDL: 45 mg/dL (ref >39)
LDL Chol Calc (NIH): 102 mg/dL — ABNORMAL HIGH (ref 0–99)
Triglycerides: 136 mg/dL (ref 0–149)
VLDL Cholesterol Cal: 24 mg/dL (ref 5–40)

## 2024-05-08 ENCOUNTER — Ambulatory Visit: Payer: Self-pay | Admitting: Family Medicine

## 2024-05-08 DIAGNOSIS — N289 Disorder of kidney and ureter, unspecified: Secondary | ICD-10-CM

## 2024-05-09 NOTE — Progress Notes (Signed)
 Called patient to get him scheduled for a 2 week lab only visit. Did not leave a vm due to [facility] request

## 2024-06-06 ENCOUNTER — Ambulatory Visit: Payer: Self-pay | Admitting: Family Medicine

## 2024-07-13 ENCOUNTER — Ambulatory Visit: Payer: Self-pay | Admitting: Family Medicine

## 2024-09-23 ENCOUNTER — Ambulatory Visit: Payer: Self-pay | Admitting: Family Medicine
